# Patient Record
Sex: Male | Born: 1950 | Race: White | Hispanic: No | Marital: Married | Smoking: Never smoker
Health system: Southern US, Community
[De-identification: ages and names within clinical notes are randomized; demographics above are authoritative.]

## PROBLEM LIST (undated history)

## (undated) DIAGNOSIS — R55 Syncope and collapse: Secondary | ICD-10-CM

## (undated) DIAGNOSIS — R400 Somnolence: Secondary | ICD-10-CM

## (undated) DIAGNOSIS — R7309 Other abnormal glucose: Secondary | ICD-10-CM

## (undated) DIAGNOSIS — E669 Obesity, unspecified: Secondary | ICD-10-CM

## (undated) DIAGNOSIS — R569 Unspecified convulsions: Secondary | ICD-10-CM

## (undated) HISTORY — PX: OTHER SURGICAL HISTORY: SHX169

## (undated) HISTORY — DX: Somnolence: R40.0

## (undated) HISTORY — DX: Unspecified convulsions: R56.9

## (undated) HISTORY — PX: TONSILLECTOMY: SUR1361

## (undated) HISTORY — DX: Other abnormal glucose: R73.09

## (undated) HISTORY — PX: KNEE SURGERY: SHX244

## (undated) HISTORY — DX: Syncope and collapse: R55

## (undated) HISTORY — DX: Obesity, unspecified: E66.9

## (undated) HISTORY — PX: CARDIAC DEFIBRILLATOR PLACEMENT: SHX171

---

## 2003-06-21 ENCOUNTER — Encounter: Payer: Self-pay | Admitting: Emergency Medicine

## 2003-06-21 ENCOUNTER — Observation Stay (HOSPITAL_COMMUNITY): Admission: AD | Admit: 2003-06-21 | Discharge: 2003-06-22 | Payer: Self-pay | Admitting: Emergency Medicine

## 2007-03-29 ENCOUNTER — Emergency Department: Payer: Self-pay | Admitting: Emergency Medicine

## 2007-03-29 ENCOUNTER — Inpatient Hospital Stay (HOSPITAL_COMMUNITY): Admission: EM | Admit: 2007-03-29 | Discharge: 2007-04-01 | Payer: Self-pay | Admitting: Emergency Medicine

## 2007-03-31 ENCOUNTER — Ambulatory Visit: Payer: Self-pay | Admitting: Internal Medicine

## 2007-03-31 ENCOUNTER — Encounter: Payer: Self-pay | Admitting: Internal Medicine

## 2007-04-25 ENCOUNTER — Ambulatory Visit: Payer: Self-pay | Admitting: Internal Medicine

## 2007-05-26 ENCOUNTER — Ambulatory Visit: Payer: Self-pay | Admitting: Internal Medicine

## 2007-07-28 ENCOUNTER — Ambulatory Visit: Payer: Self-pay | Admitting: Internal Medicine

## 2007-08-25 ENCOUNTER — Ambulatory Visit: Payer: Self-pay

## 2007-11-21 ENCOUNTER — Ambulatory Visit: Payer: Self-pay | Admitting: Internal Medicine

## 2008-03-06 ENCOUNTER — Ambulatory Visit: Payer: Self-pay | Admitting: Internal Medicine

## 2008-07-19 ENCOUNTER — Ambulatory Visit: Payer: Self-pay

## 2008-10-31 ENCOUNTER — Ambulatory Visit: Payer: Self-pay

## 2009-05-25 DIAGNOSIS — R7309 Other abnormal glucose: Secondary | ICD-10-CM | POA: Insufficient documentation

## 2009-05-25 DIAGNOSIS — R55 Syncope and collapse: Secondary | ICD-10-CM | POA: Insufficient documentation

## 2009-05-25 DIAGNOSIS — I951 Orthostatic hypotension: Secondary | ICD-10-CM | POA: Insufficient documentation

## 2009-06-04 ENCOUNTER — Ambulatory Visit: Payer: Self-pay | Admitting: Internal Medicine

## 2009-10-10 ENCOUNTER — Ambulatory Visit: Payer: Self-pay

## 2009-10-30 ENCOUNTER — Emergency Department (HOSPITAL_COMMUNITY): Admission: EM | Admit: 2009-10-30 | Discharge: 2009-10-30 | Payer: Self-pay | Admitting: Emergency Medicine

## 2009-11-05 ENCOUNTER — Encounter: Payer: Self-pay | Admitting: Internal Medicine

## 2009-11-27 ENCOUNTER — Encounter (INDEPENDENT_AMBULATORY_CARE_PROVIDER_SITE_OTHER): Payer: Self-pay | Admitting: *Deleted

## 2010-02-05 ENCOUNTER — Ambulatory Visit: Payer: Self-pay | Admitting: Internal Medicine

## 2010-08-19 ENCOUNTER — Ambulatory Visit: Payer: Self-pay | Admitting: Internal Medicine

## 2010-10-29 ENCOUNTER — Ambulatory Visit: Payer: Self-pay
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
## 2011-01-27 NOTE — Procedures (Signed)
Summary: device/saf   Current Medications (verified): 1)  Multivitamins  Tabs (Multiple Vitamin) .... Take One Tablet Once Daily 2)  Fish Oil  Oil (Fish Oil) .... Take Once Daily 3)  Dilantin 100 Mg Caps (Phenytoin Sodium Extended) .... 3 Caps At Bedtime 1 Capsule in The Morning  Allergies (verified): No Known Drug Allergies   ILR Following MD Sherryl Manges, MD DOI:  04/01/2007 Vendor:  Medtronic     Model Number:  4540     Serial Number [id number]       Tachy Episodes:  2      Tech Comments:  DEVICE REACHED RRT ON 09-09-10.  2 TACHY EPISODES RECORDED THAT WERE NOISE.  PLAN PER SK. Vella Kohler  October 29, 2010 4:39 PM

## 2011-01-27 NOTE — Procedures (Signed)
Summary: PC2   Current Medications (verified): 1)  Multivitamins  Tabs (Multiple Vitamin) .... Take One Tablet Once Daily 2)  Fish Oil  Oil (Fish Oil) .... Take Once Daily 3)  Dilantin 100 Mg Caps (Phenytoin Sodium Extended) .... 3 Caps At Bedtime  Allergies (verified): No Known Drug Allergies   ILR Following MD Sherryl Manges, MD DOI:  04/01/2007 Vendor:  Medtronic     Model Number:  9528     Serial Number [id number]       Tachy Episodes:  0     Brady Episodes:  0 ILR Next Due 07/28/2010  Tech Comments:  Normal device function. No episodes, no syncopal spells.  ROV 6 months SK.  Pt will call back with symptoms prior to appt. Gypsy Balsam RN BSN  February 06, 2010 10:32 AM

## 2011-01-27 NOTE — Cardiovascular Report (Signed)
Summary: Office Visit  Office Visit   Imported By: Marylou Mccoy 08/29/2010 16:00:53  _____________________________________________________________________  External Attachment:    Type:   Image     Comment:   External Document

## 2011-01-28 ENCOUNTER — Encounter (INDEPENDENT_AMBULATORY_CARE_PROVIDER_SITE_OTHER): Payer: BC Managed Care – PPO

## 2011-01-28 ENCOUNTER — Encounter: Payer: Self-pay | Admitting: Internal Medicine

## 2011-01-28 ENCOUNTER — Ambulatory Visit: Admit: 2011-01-28 | Payer: Self-pay | Admitting: Internal Medicine

## 2011-01-28 DIAGNOSIS — R55 Syncope and collapse: Secondary | ICD-10-CM

## 2011-02-12 NOTE — Procedures (Signed)
Summary: [facility] Device [facility]   Current Medications (verified): 1)  Multivitamins  Tabs (Multiple Vitamin) .... Take One Tablet Once Daily 2)  Fish Oil  Oil (Fish Oil) .... Take Once Daily 3)  Dilantin 100 Mg Caps (Phenytoin Sodium Extended) .... 3 Caps At Bedtime 1 Capsule in The Morning  Allergies (verified): No Known Drug Allergies   ILR Following MD Sherryl Manges, MD DOI:  04/01/2007 Vendor:  Medtronic     Model Number:  9528     Serial Number [id number]       Tachy Episodes:  0     Brady Episodes:  0  Tech Comments:  Battery is @ EOS.  Mark Salas will call and schedule an appt. with Dr. Graciela Husbands when he is ready to schedule an explant.   Mark Harm, LPN  January 28, 2011 4:36 PM

## 2011-04-01 LAB — BASIC METABOLIC PANEL
CO2: 23 mEq/L (ref 19–32)
Chloride: 103 mEq/L (ref 96–112)
Creatinine, Ser: 1.27 mg/dL (ref 0.4–1.5)
Sodium: 135 mEq/L (ref 135–145)

## 2011-04-01 LAB — RAPID URINE DRUG SCREEN, HOSP PERFORMED
Amphetamines: NOT DETECTED
Opiates: NOT DETECTED

## 2011-04-01 LAB — GLUCOSE, CAPILLARY: Glucose-Capillary: 113 mg/dL — ABNORMAL HIGH (ref 70–99)

## 2011-04-20 IMAGING — CT CT HEAD W/O CM
1 series · 16 of 30 positions shown, 20 images · non-contrast
Comparison: 03/29/07

CLINICAL DATA: Seizure

CT HEAD WITHOUT CONTRAST
TECHNIQUE: Contiguous axial images were obtained from the base of
the skull through the vertex without contrast.

[Series 2: trauma head · axial · 0.47mm/px · z∈[+159,+310]mm · 16 of 32 slices shown, 20 images]
[im 2/32  brain]
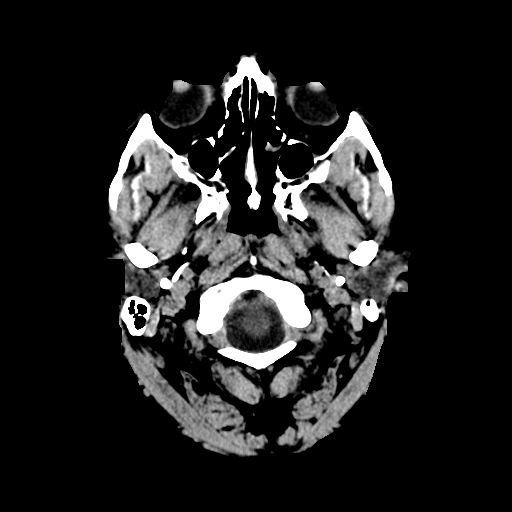
[im 2/32  bone]
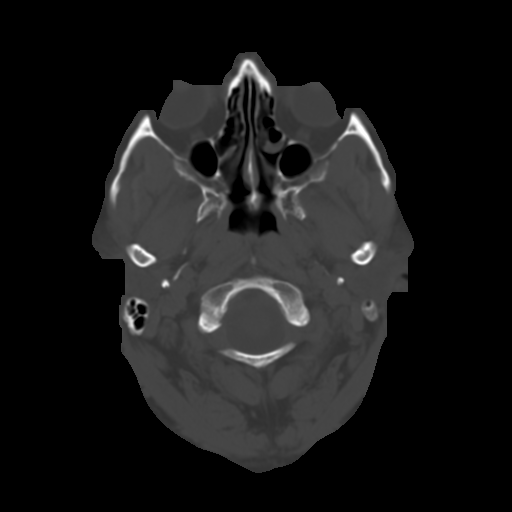
[im 4/32  brain]
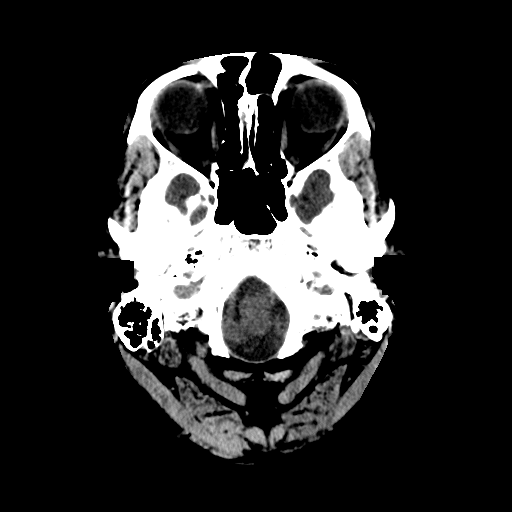
[im 6/32  brain]
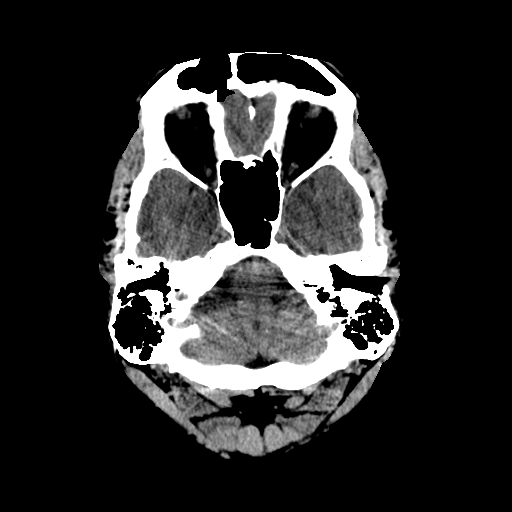
[im 8/32  brain]
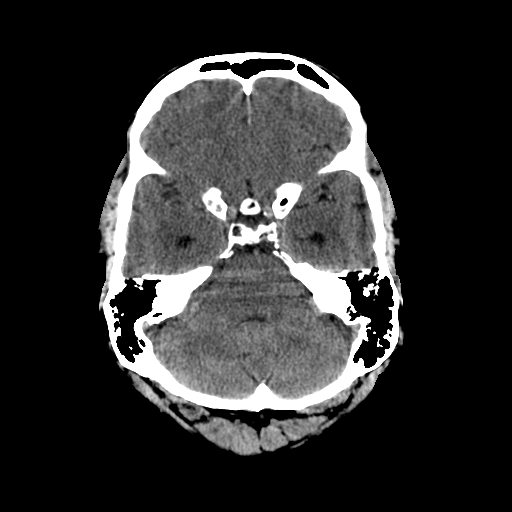
[im 9/32  brain]
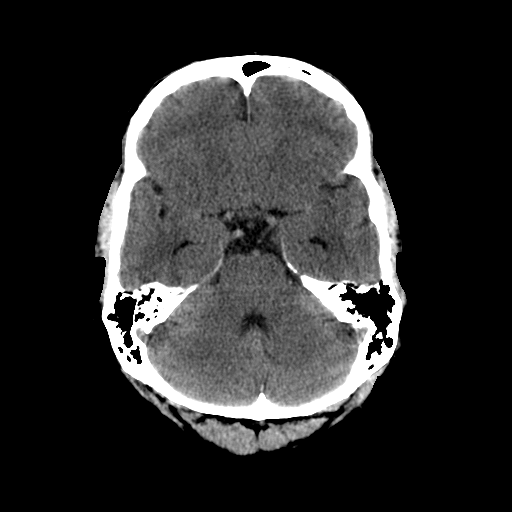
[im 9/32  bone]
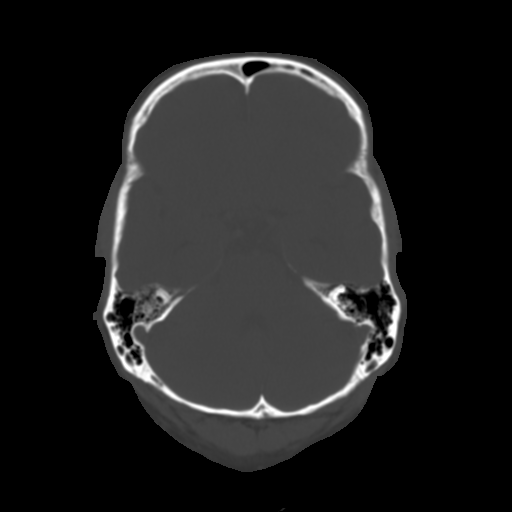
[im 11/32  brain]
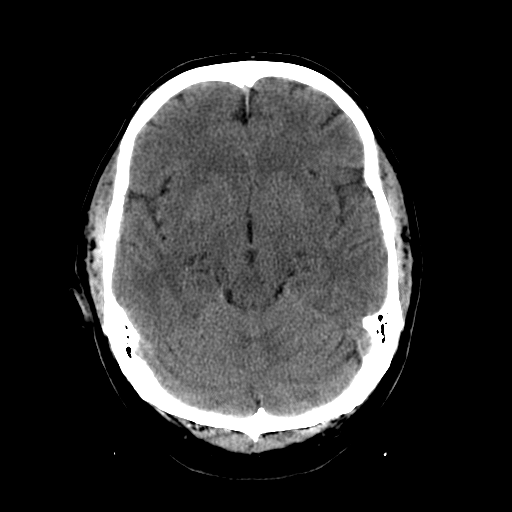
[im 13/32  brain]
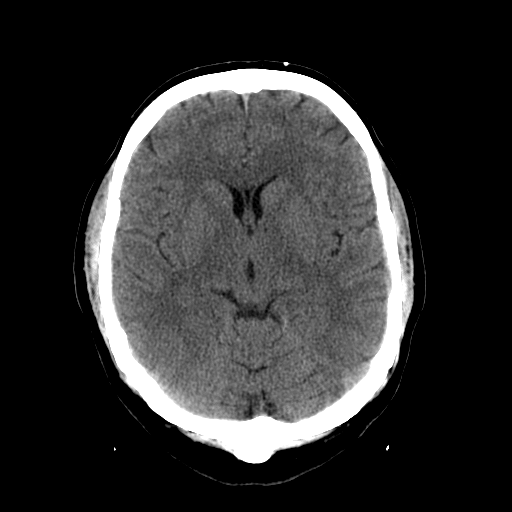
[im 15/32  brain]
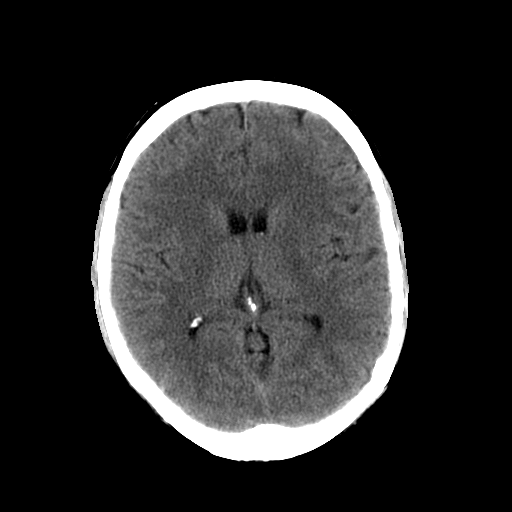
[im 17/32  brain]
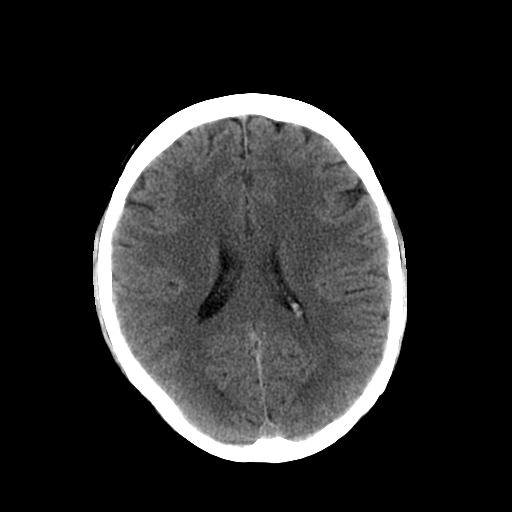
[im 17/32  bone]
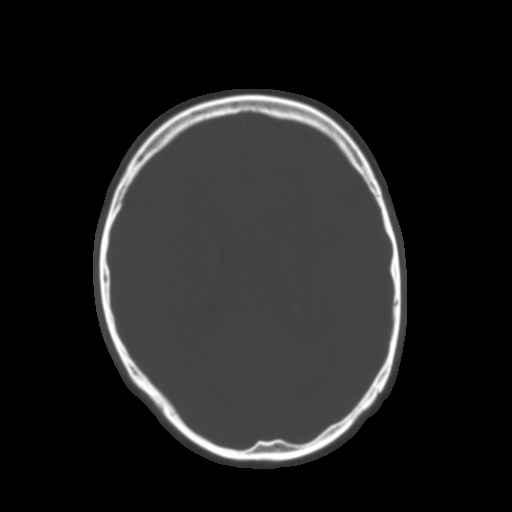
[im 19/32  brain]
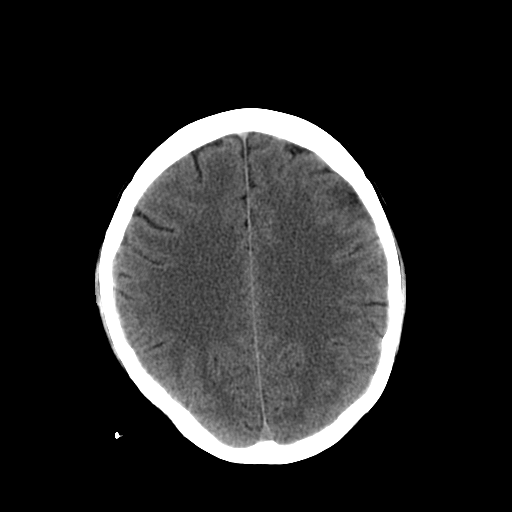
[im 21/32  brain]
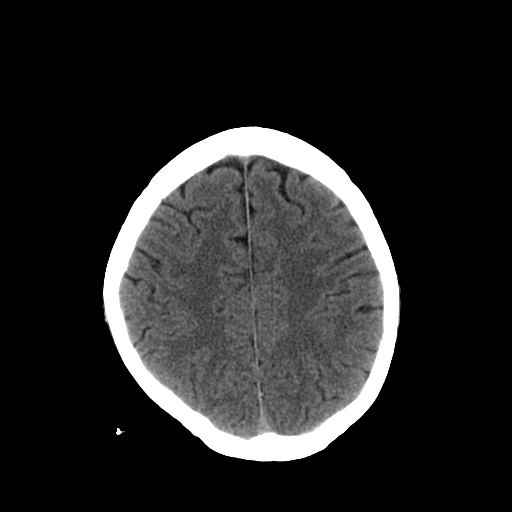
[im 23/32  brain]
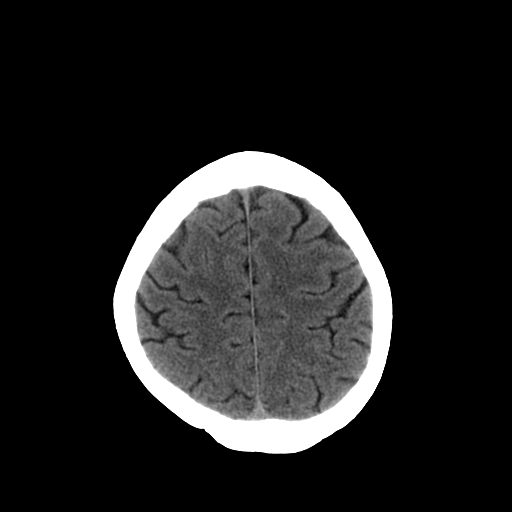
[im 24/32  brain]
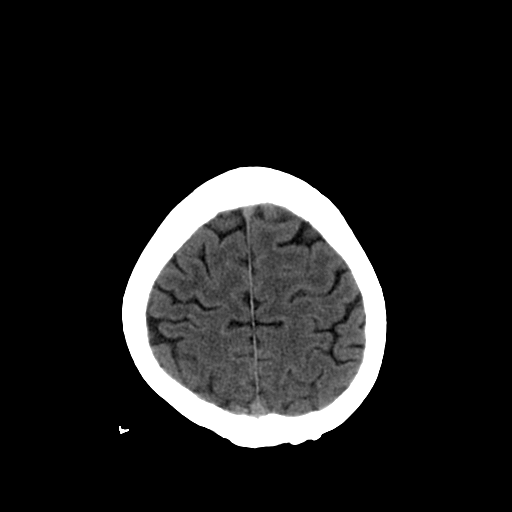
[im 24/32  bone]
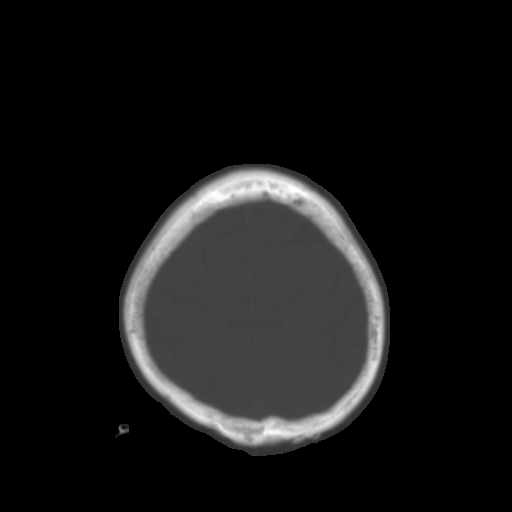
[im 26/32  brain]
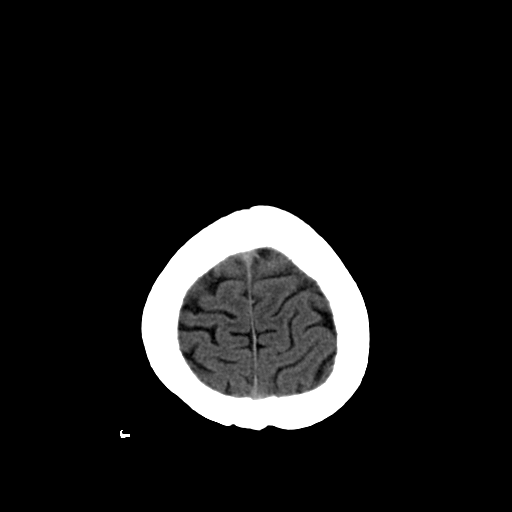
[im 28/32  brain]
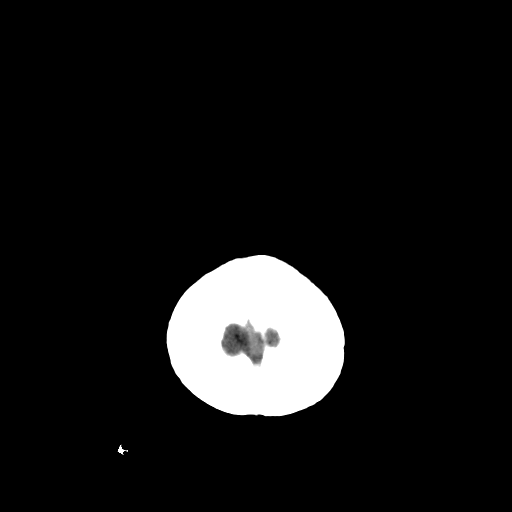
[im 30/32  brain]
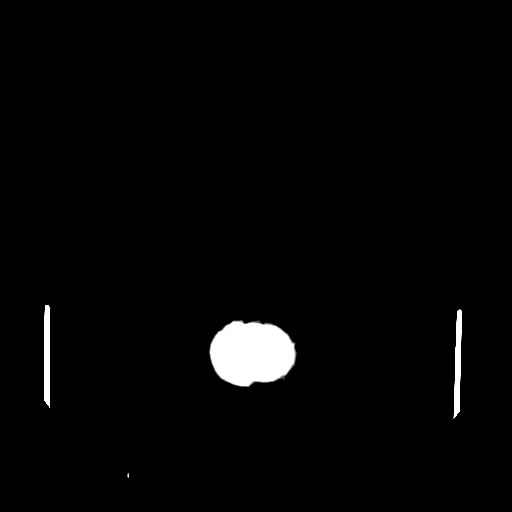

[16 of 30 positions shown; findings below may reference images not displayed]

FINDINGS: The brain has a normal appearance without evidence for hemorrhage,
infarction, hydrocephalus, or mass lesion.  There is no extra axial
fluid collection.  The skull and paranasal sinuses are normal.
IMPRESSION: Negative exam.

## 2011-04-21 ENCOUNTER — Ambulatory Visit (INDEPENDENT_AMBULATORY_CARE_PROVIDER_SITE_OTHER): Payer: BC Managed Care – PPO | Admitting: *Deleted

## 2011-04-21 ENCOUNTER — Encounter: Payer: Self-pay | Admitting: *Deleted

## 2011-04-21 VITALS — BP 144/98 | HR 90 | Ht 68.0 in | Wt 236.0 lb

## 2011-04-21 DIAGNOSIS — R55 Syncope and collapse: Secondary | ICD-10-CM

## 2011-04-23 NOTE — Patient Instructions (Signed)
Patient to follow up as scheduled. He came today for [facility] visit to have form filled out.  The patient was given completed form by Dr. Berton Mount.

## 2011-05-12 NOTE — Assessment & Plan Note (Signed)
[facility]                         ELECTROPHYSIOLOGY OFFICE NOTE   NAME:Mark Salas, Mark Salas                        MRN:          [id number]  DATE:03/06/2008                            DOB:          [birth year]    Mark Salas is seen following a syncopal episode.  He is status post loop  recorder implantation.  He has had no recurrent syncope.  Interrogation  of his loop recorder demonstrates no intercurrent events.   On examination, his blood pressure was 126/76, a pulse of 83.  His lungs were clear.  His heart sounds were regular.  Extremities were without edema   IMPRESSION:  1. Syncope.  2. Status post loop recorder implantation.   Mark Salas is stable.  We will see him again in four months' time.     Duke Salvia, MD, [facility]  Electronically Signed    SCK/MedQ  DD: 03/06/2008  DT: 03/07/2008  Job #: [id number]

## 2011-05-12 NOTE — Assessment & Plan Note (Signed)
[facility]                         ELECTROPHYSIOLOGY OFFICE NOTE   NAME:[name], Mark Salas                        MRN:          [id number]  DATE:07/28/2007                            DOB:          [birth year]    Mark Salas is seen.  He ha a history of syncope.  His loop recorder was  interrogated.  There are no intercurrent events.   He will be seen again in 2 months' time.     Duke Salvia, MD, [facility]  Electronically Signed    SCK/MedQ  DD: 07/28/2007  DT: 07/29/2007  Job #: [id number]

## 2011-05-12 NOTE — Assessment & Plan Note (Signed)
[facility]                         ELECTROPHYSIOLOGY OFFICE NOTE   NAME:Brash, CONSUELO THAYNE                        MRN:          [id number]  DATE:11/21/2007                            DOB:          1951/01/05    Mr. Interrante has had no further syncope.  He has an implantable loop  recorder which was interrogated nothing.   He has had no complaints of chest pain or shortness of breath.   PHYSICAL EXAMINATION:  VITAL SIGNS:  On examination today, his blood  pressure was mildly elevated at 148/85, pulse was 78.  LUNGS:  Clear.  HEART:  Heart sounds were regular.  EXTREMITIES:  Without edema.   Interrogation of his loop recorder demonstrated no intercurrent  episodes.   Will plan to see him again in 4 months time.  He also brings to me a  letter from the [facility], asking for a definition of syncope.  I will try to  contact them about this.     Duke Salvia, MD, [facility]  Electronically Signed    SCK/MedQ  DD: 11/21/2007  DT: 11/22/2007  Job #: [id number]

## 2011-05-12 NOTE — Assessment & Plan Note (Signed)
[facility]                         ELECTROPHYSIOLOGY OFFICE NOTE   NAME:Say, YONI LOBOS                        MRN:          [id number]  DATE:08/25/2007                            DOB:          23-Sep-1951    Mr. Hardenbrook is seen in the clinic on August 25, 2007 for followup of his  internal loop recorder DX, model No. 7528.  Date of implant was April 01, 2007, for syncope.   On interrogation of his device today there no events stored and he will  be seen again in 3 months' time.      Altha Harm, LPN  Electronically Signed      Duke Salvia, MD, [facility]  Electronically Signed   PO/MedQ  DD: 08/25/2007  DT: 08/26/2007  Job #: [id number]

## 2011-05-15 NOTE — H&P (Signed)
NAME:  Mark Salas, Mark Salas                           ACCOUNT NO.:  [id number]   MEDICAL RECORD NO.:  [id number]                   PATIENT TYPE:  EMS   LOCATION:  MINO                                 FACILITY:  [facility]   PHYSICIAN:  Gustavus Messing. Orlin Hilding, M.D.          DATE OF BIRTH:  06-23-1951   DATE OF ADMISSION:  06/21/2003  DATE OF DISCHARGE:                                HISTORY & PHYSICAL   CHIEF COMPLAINT:  Seizures.   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  He was at work at [employer] this morning when he had a seizure; his  coworkers described that he was making strange noises, fell down, lost  consciousness, bit his tongue, had convulsions in all four extremities, and  lost bladder control.  He subsequently has been postictal with  combativeness, which is gradually improving.   REVIEW OF SYSTEMS:  Unobtainable.  According to the wife, no complaints of  headache, fever, or any indisposition.  No history of tick or mosquito  exposure.   PAST MEDICAL HISTORY:  Negative for any surgery or medical problems.  Remote  history of polio affecting the left leg.   MEDICATIONS:  Multivitamin; no prescription drugs.   ALLERGIES:  No known drug allergies.   SOCIAL HISTORY:  No cigarette use.  Occasional beer.  Denies cocaine or  other recreational drugs.  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   FAMILY HISTORY:  Negative for seizures.   PHYSICAL EXAMINATION:  VITAL SIGNS:  Temperature is 97.7, pulse 112, BP  115/59.  HEENT:  Head is normocephalic, atraumatic.  NECK:  In a brace secondary to the fall with the seizure; it has not been  cleared yet.  HEART:  Regular rate and rhythm.  Slightly tachycardic.  LUNGS:  Clear to auscultation.  ABDOMEN:  Positive bowel sounds.  Soft, nontender.  EXTREMITIES:  Without edema.  NEUROLOGIC EXAM:   Mental status, he is somnolent, will arouse briefly to  stimulation, oriented to [facility] but not that he is in the ER.  He answers yes  or no questions but is not reliable.  Starts to snore again pretty quickly.  Cranial nerves II, pupils are equal and reactive.  Cannot see disc margins  well; I cannot really tell if he has a visual field cut or not.  II, IV, and  VI, extraocular movements are intact without nystagmus, ophthalmoparesis or  ptosis.  V, appear to be intact.  VII, no obvious facial asymmetry.  VIII,  hearing is intact.  IX intact, cannot really see his palate well because of  the neck brace.  He does have a tongue laceration that is not acutely  bleeding.  Motor exam, he moves all four extremities to command and  spontaneously,  required restraints.  There is no evidence of any weakness.  Reflexes are 1+ with downgoing toes.  Coordination, finger-to-nose is intact  bilaterally.   LABORATORY DATA:  CT scan of the brain is normal.  CBC, white blood cell  count is 6.4, hemoglobin 14.2, hematocrit 40.7, platelets 260.  Sodium 139,  potassium 3.8, chloride 105, CO2 of 24, BUN 19, creatinine 1.2, glucose 128,  calcium 9.5.  SGOT 28, SGPT 29, alk phos 71, total bili is 1.6, which is  slightly elevated, protein 7.0, albumin 4.1.  CK was 211, troponin was 0.01.   IMPRESSION:  Seizure, etiology is yet unclear.   PLAN:  Admit for 24-hour observation, check EEG, MRI of the brain with and  without contrast, Ativan p.r.n.  We will decide regarding the need for  anticonvulsants at a later date.                                               Catherine A. Orlin Hilding, M.D.    CAW/MEDQ  D:  06/21/2003  T:  06/22/2003  Job:  [id number]
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
## 2011-05-15 NOTE — Op Note (Signed)
NAME:  Mark Salas, FURIA NO.:  [id number]   MEDICAL RECORD NO.:  [id number]          PATIENT TYPE:  INP   LOCATION:  3743                         FACILITY:  [facility]   PHYSICIAN:  Duke Salvia, MD, FACCDATE OF BIRTH:  11-Nov-1951   DATE OF PROCEDURE:  03/29/2007  DATE OF DISCHARGE:                               OPERATIVE REPORT   PREOPERATIVE DIAGNOSIS:  Syncope.   POSTOPERATIVE DIAGNOSIS:  Syncope.   PROCEDURE:  Loop recorder insertion.   Following obtaining informed consent, the patient was then prepared for  loop recorder insertion.  The patient was transcutaneously mapped and  then prepped routinely.  Lidocaine was infiltrated at about 2 cm caudal  to the clavicle and 2 cm lateral to the sternum.  An incision was made  and carried down to the layer of the prepectoral fascia.  Using  electrocautery and sharp dissection, a pocket was formed similarly.  Hemostasis was obtained.   Two 2-0 silk sutures were used to secure a Medtronic [id number] H Reveal  [id number] loop recorder.  Hemostasis having been attained and the pocket  having been copiously irrigated with antibiotic containing saline  solution, the wound was closed in three layers in a normal fashion.  The  wound was washed and dried and a Benzoin and Steri-Strip dressing was  applied.  Needle counts, sponge counts, and instrument counts were  correct at the end of the procedure according to the staff.  The patient  tolerated the procedure without apparent complication.      Duke Salvia, MD, [facility]  Electronically Signed     SCK/MEDQ  D:  03/31/2007  T:  03/31/2007  Job:  [id number]
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
## 2011-09-25 NOTE — Progress Notes (Signed)
  Visit for [facility] form

## 2012-09-15 DIAGNOSIS — G479 Sleep disorder, unspecified: Secondary | ICD-10-CM | POA: Insufficient documentation

## 2012-09-15 DIAGNOSIS — Z5181 Encounter for therapeutic drug level monitoring: Secondary | ICD-10-CM | POA: Insufficient documentation

## 2012-09-15 DIAGNOSIS — G40309 Generalized idiopathic epilepsy and epileptic syndromes, not intractable, without status epilepticus: Secondary | ICD-10-CM | POA: Insufficient documentation

## 2012-09-15 DIAGNOSIS — G478 Other sleep disorders: Secondary | ICD-10-CM | POA: Insufficient documentation

## 2013-05-04 ENCOUNTER — Encounter: Payer: Self-pay | Admitting: Neurology

## 2013-05-04 DIAGNOSIS — G478 Other sleep disorders: Secondary | ICD-10-CM

## 2013-05-04 DIAGNOSIS — Z5181 Encounter for therapeutic drug level monitoring: Secondary | ICD-10-CM

## 2013-05-04 DIAGNOSIS — G40309 Generalized idiopathic epilepsy and epileptic syndromes, not intractable, without status epilepticus: Secondary | ICD-10-CM

## 2013-05-04 DIAGNOSIS — G479 Sleep disorder, unspecified: Secondary | ICD-10-CM

## 2013-05-05 ENCOUNTER — Encounter: Payer: Self-pay | Admitting: Neurology

## 2013-05-05 ENCOUNTER — Ambulatory Visit (INDEPENDENT_AMBULATORY_CARE_PROVIDER_SITE_OTHER): Payer: Commercial Managed Care - PPO | Admitting: Neurology

## 2013-05-05 VITALS — BP 138/79 | HR 66 | Ht 70.5 in | Wt 237.0 lb

## 2013-05-05 DIAGNOSIS — Z5181 Encounter for therapeutic drug level monitoring: Secondary | ICD-10-CM

## 2013-05-05 DIAGNOSIS — G40309 Generalized idiopathic epilepsy and epileptic syndromes, not intractable, without status epilepticus: Secondary | ICD-10-CM

## 2013-05-05 MED ORDER — PHENYTOIN SODIUM EXTENDED 100 MG PO CAPS: ORAL_CAPSULE | ORAL | Status: DC
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
## 2013-05-06 ENCOUNTER — Telehealth: Payer: Self-pay | Admitting: Neurology

## 2013-05-06 LAB — COMPREHENSIVE METABOLIC PANEL
ALT: 30 IU/L (ref 0–44)
Albumin/Globulin Ratio: 2.3 (ref 1.1–2.5)
Albumin: 4.5 g/dL (ref 3.6–4.8)
BUN: 21 mg/dL (ref 8–27)
Calcium: 9.9 mg/dL (ref 8.6–10.2)
Chloride: 107 mmol/L (ref 96–108)
GFR calc Af Amer: 103 mL/min/{1.73_m2} (ref >59)
GFR calc non Af Amer: 89 mL/min/{1.73_m2} (ref >59)
Glucose: 99 mg/dL (ref 65–99)
Potassium: 4.3 mmol/L (ref 3.5–5.2)
Total Protein: 6.5 g/dL (ref 6.0–8.5)

## 2013-05-06 LAB — PHENYTOIN LEVEL, TOTAL: Phenytoin Lvl: 2.4 ug/mL — ABNORMAL LOW (ref 10.0–20.0)

## 2013-05-06 LAB — CBC WITH DIFFERENTIAL
Basos: 0 % (ref 0–3)
Eos: 1 % (ref 0–5)
HCT: 41 % (ref 37.5–51.0)
Hemoglobin: 14.3 g/dL (ref 12.6–17.7)
Lymphs: 22 % (ref 14–46)
Neutrophils Absolute: 3.7 10*3/uL (ref 1.4–7.0)
Neutrophils Relative %: 71 % (ref 40–74)
RBC: 4.62 x10E6/uL (ref 4.14–5.80)

## 2013-05-06 NOTE — Telephone Encounter (Signed)
I called the patient. The blood work shows a low dilantin level (as usual), and the alk phos is slightly high due to the dilantin. No change in therapy.

## 2013-09-08 ENCOUNTER — Ambulatory Visit (INDEPENDENT_AMBULATORY_CARE_PROVIDER_SITE_OTHER): Payer: Commercial Managed Care - PPO | Admitting: Cardiovascular Disease

## 2013-09-08 ENCOUNTER — Encounter: Payer: Self-pay | Admitting: Cardiovascular Disease

## 2013-09-08 VITALS — BP 140/82 | HR 83 | Ht 71.0 in | Wt 237.0 lb

## 2013-09-08 DIAGNOSIS — R55 Syncope and collapse: Secondary | ICD-10-CM

## 2013-09-08 DIAGNOSIS — R9431 Abnormal electrocardiogram [ECG] [EKG]: Secondary | ICD-10-CM | POA: Insufficient documentation

## 2013-09-08 DIAGNOSIS — I951 Orthostatic hypotension: Secondary | ICD-10-CM

## 2013-09-08 NOTE — Assessment & Plan Note (Signed)
No recurrent syncopal episodes since 2008. Thus, I feel that it is safe for the patient to continue driving. Paperwork was filled today.

## 2013-09-08 NOTE — Patient Instructions (Addendum)
Your physician has requested that you have an echocardiogram. Echocardiography is a painless test that uses sound waves to create images of your heart. It provides your doctor with information about the size and shape of your heart and how well your heart's chambers and valves are working. This procedure takes approximately one hour. There are no restrictions for this procedure.  [facility] MYOVIEW  Your caregiver has ordered a Stress Test with nuclear imaging. The purpose of this test is to evaluate the blood supply to your heart muscle. This procedure is referred to as a "Non-Invasive Stress Test." This is because other than having an IV started in your vein, nothing is inserted or "invades" your body. Cardiac stress tests are done to find areas of poor blood flow to the heart by determining the extent of coronary artery disease (CAD). Some patients exercise on a treadmill, which naturally increases the blood flow to your heart, while others who are  unable to walk on a treadmill due to physical limitations have a pharmacologic/chemical stress agent called Lexiscan . This medicine will mimic walking on a treadmill by temporarily increasing your coronary blood flow.   Please note: these test may take anywhere between 2-4 hours to complete  PLEASE REPORT TO [facility]  THE VOLUNTEERS AT THE FIRST DESK WILL DIRECT YOU WHERE TO GO  Date of Procedure:________Friday, Sept 19________________  Arrival Time for Procedure:_______10:00__________________   PLEASE NOTIFY THE OFFICE AT LEAST 24 HOURS IN ADVANCE IF YOU ARE UNABLE TO KEEP YOUR APPOINTMENT.  [phone] AND  PLEASE NOTIFY NUCLEAR MEDICINE AT [facility] AT LEAST 24 HOURS IN ADVANCE IF YOU ARE UNABLE TO KEEP YOUR APPOINTMENT. [phone]  How to prepare for your Myoview test:  1. Do not eat or drink after midnight 2. No caffeine for 24 hours prior to test 3. No smoking 24 hours prior to test. 4. Your medication may be taken with water.   If your doctor stopped a medication because of this test, do not take that medication. 5. Ladies, please do not wear dresses.  Skirts or pants are appropriate. Please wear a short sleeve shirt. 6. No perfume, cologne or lotion. 7. Wear comfortable walking shoes. No heels!

## 2013-09-08 NOTE — Assessment & Plan Note (Signed)
His previous EKG was completely normal. EKG shows prominent Q waves in leads 2, 3 and aVF and showed Q waves in V5 and V6. The PR interval is short. He also has significant repolarization abnormalities that were not evident on prior EKG. These could represent atypical delta waves and WPW pattern. The EKG was reviewed with Dr. Ladona Ridgel.  The other possibility is previous inferoposterior infarct. However, the patient has no convincing evidence of angina. Given the significant change in his EKG, I recommend evaluation with an echocardiogram as well as a treadmill nuclear stress test. A treadmill stress test is on would not be diagnostic due to significant ST abnormalities at baseline EKG.

## 2013-09-08 NOTE — Progress Notes (Signed)
HPI  This is a pleasant 62 year old man who is here today for followup visit. Purpose of this visit is to fill out DMV paperwork. He had one single episode of syncope in 2008 with extensive work up. He was seen by Dr. Graciela Husbands and had an echocardiogram as well as a loop recorder. There was no evidence of documented arrhythmia and no evidence of structural heart abnormalities.  He is listed as having a seizure disorder. However, it appears from his description that he was placed on Dilantin given the unexplained syncope. He denies any chest pain, palpitations, dizziness, syncope or presyncope. He has mild exertional dyspnea. 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     No Known Allergies   Current Outpatient Prescriptions on File Prior to Visit  Medication Sig Dispense Refill  . cholecalciferol (VITAMIN D) 1000 UNITS tablet Take 1,000 Units by mouth daily.      . fish oil-omega-3 fatty acids 1000 MG capsule 1 g daily.        Marland Kitchen glucosamine-chondroitin 500-400 MG tablet Take 1 tablet by mouth 2 (two) times daily.      . Multiple Vitamin (MULTIVITAMIN) tablet Take 1 tablet by mouth daily.        . phenytoin (DILANTIN) 100 MG ER capsule Take three tablets in the pm and one tablet in the am.  120 capsule  11   No current facility-administered medications on file prior to visit.     Past Medical History  Diagnosis Date  . Syncope and collapse   . Hypotension   . Other abnormal glucose   . Seizures   . Obesity   . Drowsiness     Excessive daytime drowsiness     Past Surgical History  Procedure Laterality Date  . Tonsillectomy    . Knee surgery      right  . Loop recorder      Medtronic Reveal     Family History  Problem Relation Age of Onset  . Stroke Mother   . Heart attack Father   . Stroke Father      History   Social History  . Marital Status: Married    Spouse Name: N/A    Number of Children: 2  . Years of  Education: Masters   Occupational History  . 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 Smoking status: Never Smoker   . Smokeless tobacco: Not on file  . Alcohol Use: .5 - 1 oz/week    1-2 drink(s) per week     Comment: Consumes 2-3 drinks per day  . Drug Use: No  . Sexual Activity: Not on file   Other Topics Concern  . Not on file   Social History Narrative  . No narrative on file     ROS  a 10 point review of system was performed. It's negative other than what is mentioned in the history of present illness.   PHYSICAL EXAM   BP 140/82  Pulse 83  Ht 5\' 11"  (1.803 m)  Wt 237 lb (107.502 kg)  BMI 33.07 kg/m2 Constitutional: He is oriented to person, place, and time. He appears well-developed and well-nourished. No distress.  HENT: No nasal discharge.  Head: Normocephalic and atraumatic.  Eyes: Pupils are equal and round. Right eye exhibits no discharge. Left eye exhibits no discharge.  Neck: Normal range of motion. Neck supple. No JVD present. No thyromegaly present.  Cardiovascular:  Normal rate, regular rhythm, normal heart sounds and. Exam reveals no gallop and no friction rub. No murmur heard.  Pulmonary/Chest: Effort normal and breath sounds normal. No stridor. No respiratory distress. He has no wheezes. He has no rales. He exhibits no tenderness.  Abdominal: Soft. Bowel sounds are normal. He exhibits no distension. There is no tenderness. There is no rebound and no guarding.  Musculoskeletal: Normal range of motion. He exhibits no edema and no tenderness.  Neurological: He is alert and oriented to person, place, and time. Coordination normal.  Skin: Skin is warm and dry. No rash noted. He is not diaphoretic. No erythema. No pallor.  Psychiatric: He has a normal mood and affect. His behavior is normal. Judgment and thought content normal.       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 T wave inversion in the inferior leads as well as V2 to V4. These changes are new. There is short PR with possible delta wave.    ASSESSMENT AND PLAN

## 2013-09-15 ENCOUNTER — Other Ambulatory Visit: Payer: Self-pay

## 2013-09-15 ENCOUNTER — Ambulatory Visit: Payer: Self-pay | Admitting: Cardiovascular Disease

## 2013-09-15 DIAGNOSIS — R9431 Abnormal electrocardiogram [ECG] [EKG]: Secondary | ICD-10-CM

## 2013-09-15 DIAGNOSIS — R55 Syncope and collapse: Secondary | ICD-10-CM

## 2013-09-18 ENCOUNTER — Telehealth: Payer: Self-pay

## 2013-09-18 NOTE — Telephone Encounter (Signed)
Message copied by Marilynne Halsted on Mon Sep 18, 2013  8:55 AM ------      Message from: Lorine Bears A      Created: Mon Sep 18, 2013  8:23 AM       Inform patient that  stress test was normal. ------

## 2013-09-18 NOTE — Telephone Encounter (Signed)
Left detailed message on machine & asked pt to call with any questions or concerns.

## 2013-09-29 ENCOUNTER — Other Ambulatory Visit: Payer: Self-pay

## 2013-09-29 ENCOUNTER — Other Ambulatory Visit (INDEPENDENT_AMBULATORY_CARE_PROVIDER_SITE_OTHER): Payer: Commercial Managed Care - PPO

## 2013-09-29 DIAGNOSIS — R9431 Abnormal electrocardiogram [ECG] [EKG]: Secondary | ICD-10-CM

## 2013-09-29 DIAGNOSIS — R0602 Shortness of breath: Secondary | ICD-10-CM

## 2013-09-29 DIAGNOSIS — R55 Syncope and collapse: Secondary | ICD-10-CM

## 2013-10-04 ENCOUNTER — Telehealth: Payer: Self-pay

## 2013-10-04 NOTE — Telephone Encounter (Signed)
Message copied by Marilynne Halsted on Wed Oct 04, 2013  9:22 AM ------      Message from: Mark Salas      Created: Tue Oct 03, 2013  5:45 PM       Inform patient that echo was normal. ------

## 2013-10-04 NOTE — Telephone Encounter (Signed)
Left detailed message of results.  Asked pt to call with any questions or concerns.

## 2014-04-15 ENCOUNTER — Other Ambulatory Visit: Payer: Self-pay | Admitting: Neurology

## 2014-05-04 ENCOUNTER — Ambulatory Visit (INDEPENDENT_AMBULATORY_CARE_PROVIDER_SITE_OTHER): Payer: Commercial Managed Care - PPO | Admitting: Neurology

## 2014-05-04 ENCOUNTER — Encounter: Payer: Self-pay | Admitting: Neurology

## 2014-05-04 VITALS — BP 137/79 | HR 67 | Ht 70.0 in | Wt 236.0 lb

## 2014-05-04 DIAGNOSIS — G40309 Generalized idiopathic epilepsy and epileptic syndromes, not intractable, without status epilepticus: Secondary | ICD-10-CM

## 2014-05-04 MED ORDER — PHENYTOIN SODIUM EXTENDED 100 MG PO CAPS: ORAL_CAPSULE | ORAL | Status: DC

## 2014-05-04 NOTE — Patient Instructions (Signed)
Seizure, Adult A seizure means there is unusual activity in the brain. A seizure can cause changes in attention or behavior. Seizures often cause shaking (convulsions). Seizures often last from 30 seconds to 2 minutes. HOME CARE   If you are given medicines, take them exactly as told by your doctor.  Keep all doctor visits as told.  Do not swim or drive until your doctor says it is okay.  Teach others what to do if you have a seizure. They should:  Lay you on the ground.  Put a cushion under your head.  Loosen any tight clothing around your neck.  Turn you on your side.  Stay with you until you get better. GET HELP RIGHT AWAY IF:   The seizure lasts longer than 2 to 5 minutes.  The seizure is very bad.  The person does not wake up after the seizure.  The person's attention or behavior changes. Drive the person to the emergency room or call your local emergency services (911 in [place] MAKE SURE YOU:   Understand these instructions.  Will watch your condition.  Will get help right away if you are not doing well or get worse. Document Released: 06/01/2008 Document Revised: 03/07/2012 Document Reviewed: 12/02/2011 Exit[employer] Patient Information 2014 [employer].  

## 2014-05-04 NOTE — Progress Notes (Signed)
PATIENT: Mark Salas DOB: 03/24/1951  REASON FOR VISIT: follow up HISTORY FROM: patient  HISTORY OF PRESENT ILLNESS: Mark Salas is a 63 year old right-handed white male with a history of seizures. He returns today for follow-up. The patient has not had any seizures since last seen. Seizures have been well controlled on Dilantin. The patient continues tolerate Dilantin very well. The patient operates a motor vehicle. 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  No new medical issues since the last visit.   REVIEW OF SYSTEMS: Full 14 system review of systems performed and notable only for:  Constitutional: N/A  Eyes: N/A Ear/Nose/Throat: N/A  Skin: N/A  Cardiovascular: N/A  Respiratory: N/A  Gastrointestinal: N/A  Genitourinary: N/A Hematology/Lymphatic: N/A  Endocrine: N/A Musculoskeletal:N/A  Allergy/Immunology: N/A  Neurological: N/A Psychiatric: N/A Sleep: N/A   ALLERGIES: No Known Allergies  HOME MEDICATIONS: Outpatient Prescriptions Prior to Visit  Medication Sig Dispense Refill  . fish oil-omega-3 fatty acids 1000 MG capsule 1 g daily.        Marland Kitchen. glucosamine-chondroitin 500-400 MG tablet Take 1 tablet by mouth 2 (two) times daily.      . Multiple Vitamin (MULTIVITAMIN) tablet Take 1 tablet by mouth daily.        . phenytoin (DILANTIN) 100 MG ER capsule TAKE ONE CAPSULE BY MOUTH IN THE MORNING AND TAKE THREE CAPSULES BY MOUTH IN THE EVENING  120 capsule  0  . cholecalciferol (VITAMIN D) 1000 UNITS tablet Take 1,000 Units by mouth daily.       No facility-administered medications prior to visit.    PAST MEDICAL HISTORY: Past Medical History  Diagnosis Date  . Syncope and collapse   . Hypotension   . Other abnormal glucose   . Seizures   . Obesity   . Drowsiness     Excessive daytime drowsiness    PAST SURGICAL HISTORY: Past Surgical History  Procedure Laterality Date  . Tonsillectomy    . Knee surgery      right  . Loop recorder      Medtronic Reveal     FAMILY HISTORY: Family History  Problem Relation Age of Onset  . Stroke Mother   . Heart attack Father   . Stroke Father     SOCIAL HISTORY: History   Social History  . Marital Status: Married    Spouse Name: Rinaldo Cloudamela    Number of Children: 2  . Years of Education: Masters   Occupational History  . 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  [employer]   Social History Main Topics  . Smoking status: Never Smoker   . Smokeless tobacco: Never Used  . Alcohol Use: 0.5 - 1.0 oz/week    1-2 drink(s) per week     Comment: Consumes 2-3 drinks per day  . Drug Use: No  . Sexual Activity: Not on file   Other Topics Concern  . Not on file   Social History Narrative   Patient lives at home with his wife [name].   Patient works full time at [employer].   [employer].   Right handed.   Caffeine two daily soda.      PHYSICAL EXAM  Filed Vitals:   05/04/14 0749  BP: 137/79  Pulse: 67  Height: 5\' 10"  (1.778 m)  Weight: 236 lb (107.049 kg)   Body mass index is 33.86 kg/(m^2).  Generalized: Well developed, in no acute distress   Neurological examination  Mentation: Alert oriented  to time, place, history taking. Follows all commands speech and language fluent Cranial nerve II-XII:  Extraocular movements were full, visual field were full on confrontational test.  Motor: The motor testing reveals 5 over 5 strength of all 4 extremities. Good symmetric motor tone is noted throughout.  Coordination: Cerebellar testing reveals good finger-nose-finger and heel-to-shin bilaterally.  Gait and station: Gait is normal. Tandem gait is normal. Romberg is negative. No drift is seen.  Reflexes: Deep tendon reflexes are symmetric and normal bilaterally.    DIAGNOSTIC DATA (LABS, IMAGING, TESTING) - I reviewed patient records, labs, notes, testing and imaging myself where available.  Lab Results  Component Value Date   WBC 5.3 05/05/2013   HGB 14.3  05/05/2013   HCT 41.0 05/05/2013   MCV 89 05/05/2013   PLT 211 05/05/2013      Component Value Date/Time   NA 142 05/05/2013 0820   NA 135 10/30/2009 1425   K 4.3 05/05/2013 0820   CL 107 05/05/2013 0820   CO2 26 05/05/2013 0820   GLUCOSE 99 05/05/2013 0820   GLUCOSE 118* 10/30/2009 1425   BUN 21 05/05/2013 0820   BUN 15 10/30/2009 1425   CREATININE 0.93 05/05/2013 0820   CALCIUM 9.9 05/05/2013 0820   PROT 6.5 05/05/2013 0820   AST 20 05/05/2013 0820   ALT 30 05/05/2013 0820   ALKPHOS 130* 05/05/2013 0820   BILITOT 0.4 05/05/2013 0820   GFRNONAA 89 05/05/2013 0820   GFRAA 103 05/05/2013 0820     ASSESSMENT AND PLAN 63 y.o. year old male  has a past medical history of Syncope and collapse; Hypotension; Other abnormal glucose; Seizures; Obesity; and Drowsiness. here with   1. Generalized convulsive epilepsy without mention of intractable epilepsy  Seizures have been well controlled on dilantin. Continue Dilantin, will refill today Follow-up in 1 year or sooner if needed.     [name], MSN, NP-C 05/04/2014, 7:59 AM [facility] [address], [address] [place], [place] [address] [phone]  Note: This document was prepared with digital dictation and possible smart phrase technology. Any transcriptional errors that result from this process are unintentional. [name]

## 2014-05-15 ENCOUNTER — Telehealth: Payer: Self-pay | Admitting: Adult Health

## 2014-05-15 NOTE — Telephone Encounter (Signed)
Patient has lab work ordered from office visit on 05/04/14. I called the patient to remind him about the lab work and to let him know he can stop by the office anytime to get it drawn.

## 2014-05-18 ENCOUNTER — Other Ambulatory Visit (INDEPENDENT_AMBULATORY_CARE_PROVIDER_SITE_OTHER): Payer: Self-pay

## 2014-05-18 DIAGNOSIS — Z0289 Encounter for other administrative examinations: Secondary | ICD-10-CM

## 2014-05-18 LAB — COMPREHENSIVE METABOLIC PANEL
ALK PHOS: 120 IU/L — AB (ref 39–117)
ALT: 27 IU/L (ref 0–44)
AST: 19 IU/L (ref 0–40)
Albumin/Globulin Ratio: 2 (ref 1.1–2.5)
Albumin: 4.3 g/dL (ref 3.6–4.8)
BUN / CREAT RATIO: 19 (ref 10–22)
BUN: 18 mg/dL (ref 8–27)
CALCIUM: 9.4 mg/dL (ref 8.6–10.2)
CHLORIDE: 103 mmol/L (ref 96–108)
CO2: 21 mmol/L (ref 18–29)
Creatinine, Ser: 0.93 mg/dL (ref 0.76–1.27)
GFR calc Af Amer: 102 mL/min/{1.73_m2} (ref >59)
GFR calc non Af Amer: 88 mL/min/{1.73_m2} (ref >59)
Globulin, Total: 2.2 g/dL (ref 1.5–4.5)
Glucose: 123 mg/dL — ABNORMAL HIGH (ref 65–99)
POTASSIUM: 4 mmol/L (ref 3.5–5.2)
SODIUM: 136 mmol/L (ref 134–144)
Total Bilirubin: 0.3 mg/dL (ref 0.0–1.2)
Total Protein: 6.5 g/dL (ref 6.0–8.5)

## 2014-05-18 LAB — CBC WITH DIFFERENTIAL
BASOS: 0 %
Basophils Absolute: 0 10*3/uL (ref 0.0–0.2)
EOS ABS: 0.2 10*3/uL (ref 0.0–0.4)
EOS: 3 %
HCT: 38.7 % (ref 37.5–51.0)
HEMOGLOBIN: 14 g/dL (ref 12.6–17.7)
LYMPHS: 27 %
Lymphocytes Absolute: 1.3 10*3/uL (ref 0.7–3.1)
MCH: 31.5 pg (ref 26.6–33.0)
MCHC: 36.2 g/dL — AB (ref 31.5–35.7)
MCV: 87 fL (ref 79–97)
MONOS ABS: 0.3 10*3/uL (ref 0.1–0.9)
Monocytes: 6 %
NEUTROS PCT: 64 %
Neutrophils Absolute: 3.2 10*3/uL (ref 1.4–7.0)
PLATELETS: 207 10*3/uL (ref 150–379)
RBC: 4.45 x10E6/uL (ref 4.14–5.80)
RDW: 14.2 % (ref 12.3–15.4)
WBC: 5 10*3/uL (ref 3.4–10.8)

## 2014-05-18 LAB — PHENYTOIN LEVEL, TOTAL: PHENYTOIN LVL: 4.1 ug/mL — AB (ref 10.0–20.0)

## 2014-05-22 ENCOUNTER — Telehealth: Payer: Self-pay | Admitting: Neurology

## 2014-05-22 NOTE — Telephone Encounter (Signed)
Message copied by Stephanie Acre on Tue May 22, 2014  8:22 AM ------      Message from: Enedina Finner      Created: Tue May 22, 2014  8:17 AM                   ----- Message -----         From: [facility] Lab Results In Interface         Sent: 05/18/2014   4:42 PM           To: Butch Penny, NP             ------

## 2014-05-22 NOTE — Telephone Encounter (Signed)
I called patient. The blood work was unremarkable with exception that the alkaline phosphatase was minimally elevated, stable. The Dilantin level remains low, but the patient is well controlled on this dosing. No dosing adjustments.

## 2014-09-07 DIAGNOSIS — E785 Hyperlipidemia, unspecified: Secondary | ICD-10-CM | POA: Insufficient documentation

## 2014-11-14 ENCOUNTER — Encounter: Payer: Self-pay | Admitting: Neurology

## 2014-11-20 ENCOUNTER — Encounter: Payer: Self-pay | Admitting: Neurology

## 2015-05-10 ENCOUNTER — Ambulatory Visit (INDEPENDENT_AMBULATORY_CARE_PROVIDER_SITE_OTHER): Payer: Commercial Managed Care - PPO | Admitting: Neurology

## 2015-05-10 ENCOUNTER — Encounter: Payer: Self-pay | Admitting: Neurology

## 2015-05-10 VITALS — BP 128/72 | HR 67 | Ht 71.0 in | Wt 237.0 lb

## 2015-05-10 DIAGNOSIS — G40309 Generalized idiopathic epilepsy and epileptic syndromes, not intractable, without status epilepticus: Secondary | ICD-10-CM

## 2015-05-10 DIAGNOSIS — Z5181 Encounter for therapeutic drug level monitoring: Secondary | ICD-10-CM | POA: Diagnosis not present

## 2015-05-10 MED ORDER — PHENYTOIN SODIUM EXTENDED 100 MG PO CAPS: ORAL_CAPSULE | ORAL | Status: DC

## 2015-05-10 NOTE — Progress Notes (Signed)
Reason for visit: Seizures  Mark Salas is an 64 y.o. male  HiTia Alertstory of present illness:  Mark Salas is a 64 year old right-handed white male with a history of seizures, well controlled on Dilantin. The patient was last seen one year ago, he has not had any seizures since that time. The patient indicates that he is operating a motor vehicle, and he is working. He is not having any significant issues in this regard. The Dilantin is well tolerated, he denies any drowsiness. He denies any new medical issues that have come up since last seen. He returns for routine reevaluation. He had blood work done through his primary care physician within the last several weeks that included a CBC, comprehensive metabolic profile, cholesterol panel. He indicates that these were unremarkable. A Dilantin level was not checked.  Past Medical History  Diagnosis Date  . Syncope and collapse   . Hypotension   . Other abnormal glucose   . Seizures   . Obesity   . Drowsiness     Excessive daytime drowsiness    Past Surgical History  Procedure Laterality Date  . Tonsillectomy    . Knee surgery      right  . Loop recorder      Medtronic Reveal    Family History  Problem Relation Age of Onset  . Stroke Mother   . Heart attack Father   . Stroke Father     Social history:  reports that he has never smoked. He has never used smokeless tobacco. He reports that he drinks about 0.5 - 1.0 oz of alcohol per week. He reports that he does not use illicit drugs.   No Known Allergies  Medications:  Prior to Admission medications   Medication Sig Start Date End Date Taking? Authorizing Provider  fish oil-omega-3 fatty acids 1000 MG capsule 1 g daily.     Yes Historical Provider, MD  Multiple Vitamin (MULTIVITAMIN) tablet Take 1 tablet by mouth daily.     Yes Historical Provider, MD  phenytoin (DILANTIN) 100 MG ER capsule TAKE ONE CAPSULE BY MOUTH IN THE MORNING AND TAKE THREE CAPSULES BY MOUTH IN THE  EVENING 05/04/14  Yes Butch PennyMegan Millikan, NP    ROS:  Out of a complete 14 system review of symptoms, the patient complains only of the following symptoms, and all other reviewed systems are negative.  History of seizures  Blood pressure 128/72, pulse 67, height 5\' 11"  (1.803 m), weight 237 lb (107.502 kg).  Physical Exam  General: The patient is alert and cooperative at the time of the examination.  Neuromuscular: There is atrophy of the left lower leg as compared to the right.  Skin: No significant peripheral edema is noted.   Neurologic Exam  Mental status: The patient is alert and oriented x 3 at the time of the examination. The patient has apparent normal recent and remote memory, with an apparently normal attention span and concentration ability.   Cranial nerves: Facial symmetry is present. Speech is normal, no aphasia or dysarthria is noted. Extraocular movements are full. Visual fields are full.  Motor: The patient has good strength in all 4 extremities.  Sensory examination: Soft touch sensation is symmetric on the face, arms, and legs.  Coordination: The patient has good finger-nose-finger and heel-to-shin bilaterally.  Gait and station: The patient has a normal gait. Tandem gait is normal. Romberg is negative. No drift is seen.  Reflexes: Deep tendon reflexes are symmetric.   Assessment/Plan:  1.  History of seizures, well controlled  The patient is doing quite well at this time. We will continue the current dose of Dilantin. The Dilantin levels tend to run low, but the patient is well controlled on this dosage. He will otherwise follow-up in one year.  [name]. Keith Willis MD 05/11/2015 6:24 PM  [facility] [address] [place], [place] [address]  Phone [phone] Fax [phone]

## 2015-05-10 NOTE — Patient Instructions (Signed)

## 2015-05-11 LAB — PHENYTOIN LEVEL, TOTAL: Phenytoin Lvl: 6 ug/mL — ABNORMAL LOW (ref 10.0–20.0)

## 2015-05-13 ENCOUNTER — Telehealth: Payer: Self-pay

## 2015-05-13 NOTE — Telephone Encounter (Signed)
-----   Message from [name], MD sent at 05/11/2015  5:43 PM EDT ----- Dilantin level is low, patient has been well controlled on this dosing, no changes. Please call the patient. ----- Message -----    From: [facility] Lab Results In Interface    Sent: 05/11/2015   5:43 AM      To: [name], MD

## 2015-05-13 NOTE — Telephone Encounter (Signed)
Left voicemail relaying results on both home and cell numbers.

## 2015-08-29 DIAGNOSIS — E872 Acidosis: Secondary | ICD-10-CM | POA: Insufficient documentation

## 2015-08-29 DIAGNOSIS — E8729 Other acidosis: Secondary | ICD-10-CM | POA: Insufficient documentation

## 2015-08-29 DEATH — deceased

## 2015-09-13 DIAGNOSIS — I472 Ventricular tachycardia, unspecified: Secondary | ICD-10-CM | POA: Insufficient documentation

## 2015-09-18 ENCOUNTER — Telehealth: Payer: Self-pay | Admitting: Neurology

## 2015-09-18 NOTE — Telephone Encounter (Signed)
I called the patient, left a message. I will call back tomorrow.

## 2015-09-18 NOTE — Telephone Encounter (Signed)
I called patient at home and on the cell phone number, left messages. I will call back later.

## 2015-09-18 NOTE — Telephone Encounter (Addendum)
Pt was recently in the hospital at [facility]. Not a [place]. He was told there that they wanted to change some medications that was given to him by Dr. Anne Hahn. The pt does not want to change anything until he hears from Dr. Anne Hahn. Medication: phenytoin (DILANTIN) 100 MG ER capsule to change to keppra  twice a daily. Pt is going to continue to take Dilantin until he hears from Dr. Anne Hahn. Pt was coded while at work. Pt has an a-arythmia Pt would like to make appt to have a f/u as well. The next available appt is not until March. Pt expressed he was ok not making an appt right now that if Dr. Anne Hahn called and talked to him that would be fine . Pt just wanted to make Dr. Anne Hahn aware of what was going on with him.  Please call [phone]

## 2015-09-19 NOTE — Telephone Encounter (Signed)
I called the patient. The patient has had documented heart rhythm abnormalities , a  Defibrillator has been placed. The patient remains on Dilantin. In the past, MRI evaluation the brain and EEG study have been normal. The patient has had witnessed generalized seizure events with tongue biting. The question will be whether or not the heart rhythm abnormality is the etiology of the syncope and the seizure itself , or does he truly had epilepsy. The patient may decide to taper off of the Dilantin, if so, rechecking an EEG study should be done. The patient will follow-up for his regular revisit.

## 2015-10-11 DIAGNOSIS — Z95810 Presence of automatic (implantable) cardiac defibrillator: Secondary | ICD-10-CM | POA: Insufficient documentation

## 2015-10-25 DIAGNOSIS — R739 Hyperglycemia, unspecified: Secondary | ICD-10-CM | POA: Insufficient documentation

## 2016-04-16 DIAGNOSIS — G40209 Localization-related (focal) (partial) symptomatic epilepsy and epileptic syndromes with complex partial seizures, not intractable, without status epilepticus: Secondary | ICD-10-CM | POA: Insufficient documentation

## 2016-04-20 ENCOUNTER — Telehealth: Payer: Self-pay | Admitting: Neurology

## 2016-04-20 NOTE — Telephone Encounter (Signed)
Pt called in due having a seizure at work. Lasted only a few seconds, lost bladder control, passed out, admitted to [facility] for over night observation. He would like to know about coming in earlier than his May 19th appt. Please call [phone] home or [phone] cell. Thank you

## 2016-04-20 NOTE — Telephone Encounter (Signed)
Returned pt TC. Sooner appt scheduled 04/28/16. Pt will arrive 15 min prior to appt time.

## 2016-04-28 ENCOUNTER — Ambulatory Visit (INDEPENDENT_AMBULATORY_CARE_PROVIDER_SITE_OTHER): Payer: Commercial Managed Care - PPO | Admitting: Neurology

## 2016-04-28 ENCOUNTER — Encounter: Payer: Self-pay | Admitting: Neurology

## 2016-04-28 VITALS — BP 136/74 | HR 80 | Ht 71.0 in | Wt 242.5 lb

## 2016-04-28 DIAGNOSIS — G40309 Generalized idiopathic epilepsy and epileptic syndromes, not intractable, without status epilepticus: Secondary | ICD-10-CM

## 2016-04-28 MED ORDER — LEVETIRACETAM 500 MG PO TABS: 500.0000 mg | ORAL_TABLET | Freq: Two times a day (BID) | ORAL | Status: DC

## 2016-04-28 NOTE — Progress Notes (Signed)
Reason for visit: Seizures  Tia AlertDavid S Salas is an 65 y.o. male  History of present illness:  [name] is a 65 year old right-handed white male with a history of seizure events. In September 2016, he had a seizure-type episode, he was found to have ventricular tachycardia and torsades on an electrophysiologic evaluation, and he was felt that his seizure-type events could be related to this. A defibrillator/pacer was placed and the patient has done well, and he was continued on the Dilantin, but on 04/20/2016 he suffered another seizure event while at work. The patient was admitted overnight for observation, the pacemaker was interrogated and no evidence of cardiac rhythm and abnormalities were noted. The patient believes that he may have missed a dose or 2 of Dilantin prior to the onset of the seizure. He has never been able to tolerate Dilantin levels in the therapeutic range because of drowsiness. The patient did have urinary incontinence with the event, he denied any tongue biting. The episode occurred without any warning. He works as a Garment/textile technologistnurse anesthetist. The patient returns for an evaluation.  Past Medical History  Diagnosis Date  . Syncope and collapse   . Hypotension   . Other abnormal glucose   . Seizures (HCC)   . Obesity   . Drowsiness     Excessive daytime drowsiness    Past Surgical History  Procedure Laterality Date  . Tonsillectomy    . Knee surgery      right  . Loop recorder      Medtronic Reveal    Family History  Problem Relation Age of Onset  . Stroke Mother   . Heart attack Father   . Stroke Father     Social history:  reports that he has never smoked. He has never used smokeless tobacco. He reports that he drinks about 0.5 - 1.0 oz of alcohol per week. He reports that he does not use illicit drugs.   No Known Allergies  Medications:  Prior to Admission medications   Medication Sig Start Date End Date Taking? Authorizing Provider  aspirin EC 81 MG  tablet Take by mouth. 08/31/15 08/30/16 Yes Historical Provider, MD  levETIRAcetam (KEPPRA) 500 MG tablet Take 1 tablet (500 mg total) by mouth 2 (two) times daily. 04/28/16 05/28/16 Yes [name], MD  Calcium Citrate-Vitamin D (CALCIUM + D PO) Take by mouth.    Historical Provider, MD  fish oil-omega-3 fatty acids 1000 MG capsule 1 g daily.      Historical Provider, MD  glucosamine-chondroitin (GLUCOSAMINE-CHONDROITIN DS) 500-400 MG tablet Take by mouth.    Historical Provider, MD  Multiple Vitamin (MULTIVITAMIN) tablet Take 1 tablet by mouth daily.      Historical Provider, MD    ROS:  Out of a complete 14 system review of symptoms, the patient complains only of the following symptoms, and all other reviewed systems are negative.  Passing out, seizure  Blood pressure 136/74, pulse 80, height 5\' 11"  (1.803 m), weight 242 lb 8 oz (109.997 kg).  Physical Exam  General: The patient is alert and cooperative at the time of the examination. The patient is moderately obese.  Skin: No significant peripheral edema is noted.   Neurologic Exam  Mental status: The patient is alert and oriented x 3 at the time of the examination. The patient has apparent normal recent and remote memory, with an apparently normal attention span and concentration ability.   Cranial nerves: Facial symmetry is present. Speech is normal, no  aphasia or dysarthria is noted. Extraocular movements are full. Visual fields are full.  Motor: The patient has good strength in all 4 extremities.  Sensory examination: Soft touch sensation is symmetric on the face, arms, and legs.  Coordination: The patient has good finger-nose-finger and heel-to-shin bilaterally.  Gait and station: The patient has a normal gait. Tandem gait is normal. Romberg is negative. No drift is seen.  Reflexes: Deep tendon reflexes are symmetric.   Assessment/Plan:  1. Seizures with recent recurrence  The patient will be maintained on the  Keppra, a prescription was called in. The patient seems to be tolerating medication fairly well. He is currently off of Dilantin. We will follow-up in 6 months, he is not to operate a motor vehicle until he is seen next. He may return to work without restrictions at this time.  [name] MD 04/28/2016 8:32 PM  [facility] [address] [place], [place] [address]  Phone [phone] Fax [phone]

## 2016-04-29 ENCOUNTER — Telehealth: Payer: Self-pay | Admitting: *Deleted

## 2016-04-29 NOTE — Telephone Encounter (Signed)
Patient FMLA on [employer] desk.

## 2016-04-29 NOTE — Telephone Encounter (Signed)
Patient called to advise, Employer has forms for healthcare clearance to be filled out for patient. Patient advised we received FMLA forms today, patient states this is a different form to be filled out. Advised of $25 fee for each set of forms and 14 day turn around time. Patient will try to get form from employer and drop by our office tomorrow, will pay form fees at that time.

## 2016-05-01 DIAGNOSIS — Z0289 Encounter for other administrative examinations: Secondary | ICD-10-CM

## 2016-05-07 NOTE — Telephone Encounter (Signed)
Additional paperwork completed and faxed to Ezzard StandingPam Kemp, Occupation RN for [employer] [phone](F # [phone]) - permission to return to work w/o restrictions. Copy mailed to pt.

## 2016-05-13 ENCOUNTER — Telehealth: Payer: Self-pay | Admitting: *Deleted

## 2016-05-13 NOTE — Telephone Encounter (Signed)
Message [facility]: [facility]                  Taken 17-MAY-17 at  8:06AM by RMS ------------------------------------------------------------ Edgardo Roysaller DAVE Kaczynski                 CID [id number]  Patient SAME                 Pt's Dr Anne HahnWILLIS       Area Code 336 Phone# [phone] * DOB [phone]     RE CANCELLING 5/19 @9AM -WAS SEEN A FEW WEEKS AGO     AND IT ISNT NEEDED                                   Disp:Y/N N If Y = C/B If No Response In 20minutes ============================================================ Pt r/s for 11/17.

## 2016-05-15 ENCOUNTER — Ambulatory Visit: Payer: Commercial Managed Care - PPO | Admitting: Neurology

## 2016-10-30 ENCOUNTER — Ambulatory Visit (INDEPENDENT_AMBULATORY_CARE_PROVIDER_SITE_OTHER): Payer: Commercial Managed Care - PPO | Admitting: Neurology

## 2016-10-30 ENCOUNTER — Encounter: Payer: Self-pay | Admitting: Neurology

## 2016-10-30 VITALS — BP 143/83 | HR 76 | Ht 71.0 in | Wt 246.0 lb

## 2016-10-30 DIAGNOSIS — G40309 Generalized idiopathic epilepsy and epileptic syndromes, not intractable, without status epilepticus: Secondary | ICD-10-CM

## 2016-10-30 NOTE — Progress Notes (Signed)
Reason for visit: Seizures  Tia AlertDavid S Salas is an 65 y.o. male  History of present illness:  [name] is a 89 old right-handed white male with a history of seizures. The patient had not tolerated the Dilantin well, he was switched to Keppra, he has done well with this medication. He has gone 6 months without any seizures. He denies any drowsiness or irritability on the Keppra. He denies any other new medical issues that have come up since last seen. He returns for an evaluation.  Past Medical History:  Diagnosis Date  . Drowsiness    Excessive daytime drowsiness  . Hypotension   . Obesity   . Other abnormal glucose   . Seizures (HCC)   . Syncope and collapse     Past Surgical History:  Procedure Laterality Date  . KNEE SURGERY     right  . loop recorder     Medtronic Reveal  . TONSILLECTOMY      Family History  Problem Relation Age of Onset  . Stroke Mother   . Heart attack Father   . Stroke Father     Social history:  reports that he has never smoked. He has never used smokeless tobacco. He reports that he drinks about 0.5 - 1.0 oz of alcohol per week . He reports that he does not use drugs.   No Known Allergies  Medications:  Prior to Admission medications   Medication Sig Start Date End Date Taking? Authorizing Provider  Calcium Citrate-Vitamin D (CALCIUM + D PO) Take by mouth.    Historical Provider, MD  glucosamine-chondroitin (GLUCOSAMINE-CHONDROITIN DS) 500-400 MG tablet Take by mouth.    Historical Provider, MD  levETIRAcetam (KEPPRA) 500 MG tablet Take 1 tablet (500 mg total) by mouth 2 (two) times daily. 04/28/16 05/28/16  [name], MD  Multiple Vitamin (MULTIVITAMIN) tablet Take 1 tablet by mouth daily.      Historical Provider, MD    ROS:  Out of a complete 14 system review of symptoms, the patient complains only of the following symptoms, and all other reviewed systems are negative.  Seizures  Blood pressure (!) 143/83, pulse 76, height 5'  11" (1.803 m), weight 246 lb (111.6 kg).  Physical Exam  General: The patient is alert and cooperative at the time of the examination. The patient is moderately obese.  Skin: No significant peripheral edema is noted.   Neurologic Exam  Mental status: The patient is alert and oriented x 3 at the time of the examination. The patient has apparent normal recent and remote memory, with an apparently normal attention span and concentration ability.   Cranial nerves: Facial symmetry is present. Speech is normal, no aphasia or dysarthria is noted. Extraocular movements are full. Visual fields are full.  Motor: The patient has good strength in all 4 extremities.  Sensory examination: Soft touch sensation is symmetric on the face, arms, and legs.  Coordination: The patient has good finger-nose-finger and heel-to-shin bilaterally.  Gait and station: The patient has a normal gait. Tandem gait is normal. Romberg is negative. No drift is seen.  Reflexes: Deep tendon reflexes are symmetric.   Assessment/Plan:  1. History of seizures  The patient is doing well on Keppra, we will continue the medication at 1000 mg daily. The patient will follow-up in one year, sooner if needed.  [name]. Keith Willis MD 10/30/2016 9:17 AM  [facility] [address] [place], [place] [address]  Phone [phone] Fax [phone]

## 2017-02-18 ENCOUNTER — Other Ambulatory Visit: Payer: Self-pay | Admitting: Neurology

## 2017-11-04 DIAGNOSIS — R569 Unspecified convulsions: Secondary | ICD-10-CM | POA: Insufficient documentation

## 2017-11-04 DIAGNOSIS — I2510 Atherosclerotic heart disease of native coronary artery without angina pectoris: Secondary | ICD-10-CM | POA: Insufficient documentation

## 2017-11-04 NOTE — Progress Notes (Signed)
[facility]  PATIENT: Mark Salas DOB: 01/06/1951   REASON FOR VISIT: Follow-up for seizure disorder HISTORY FROM:    HISTORY OF PRESENT ILLNESS:UPDATE 11/9/2018CM [name], 66 year old male returns for follow-up with history of seizure disorder.  Last seizure was approximately 2 years ago.  He is currently on Keppra 500 mg twice daily without side effects.  He specifically denies any irritability or drowsiness on Keppra.  He continues to work full-time, drives a vehicle without difficulty.  No new medical issues since last seen.  He returns for reevaluation  10/30/16 KWMr. Mark Salas is a [age] old right-handed white male with a history of seizures. The patient had not tolerated the Dilantin well, he was switched to Keppra, he has done well with this medication. He has gone 6 months without any seizures. He denies any drowsiness or irritability on the Keppra. He denies any other new medical issues that have come up since last seen. He returns for an evaluation  REVIEW OF SYSTEMS: Full 14 system review of systems performed and notable only for those listed, all others are neg:  Constitutional: neg  Cardiovascular: neg Ear/Nose/Throat: neg  Skin: neg Eyes: neg Respiratory: neg Gastroitestinal: neg  Hematology/Lymphatic: neg  Endocrine: neg Musculoskeletal:neg Allergy/Immunology: neg Neurological: neg Psychiatric: neg Sleep : neg   ALLERGIES: No Known Allergies  HOME MEDICATIONS: Outpatient Medications Prior to Visit  Medication Sig Dispense Refill  . Calcium Citrate-Vitamin D (CALCIUM + D PO) Take by mouth.    . levETIRAcetam (KEPPRA) 500 MG tablet TAKE ONE TABLET BY MOUTH TWICE DAILY 180 tablet 2  . Multiple Vitamin (MULTIVITAMIN) tablet Take 1 tablet by mouth daily.      Marland Kitchen. glucosamine-chondroitin (GLUCOSAMINE-CHONDROITIN DS) 500-400 MG tablet Take by mouth.     No facility-administered medications prior to visit.     PAST MEDICAL HISTORY: Past Medical  History:  Diagnosis Date  . Drowsiness    Excessive daytime drowsiness  . Hypotension   . Obesity   . Other abnormal glucose   . Seizures (HCC)   . Syncope and collapse     PAST SURGICAL HISTORY: Past Surgical History:  Procedure Laterality Date  . CARDIAC DEFIBRILLATOR PLACEMENT    . KNEE SURGERY     right  . loop recorder     Medtronic Reveal  . TONSILLECTOMY      FAMILY HISTORY: Family History  Problem Relation Age of Onset  . Stroke Mother   . Heart attack Father   . Stroke Father     SOCIAL HISTORY: Social History   Socioeconomic History  . Marital status: Married    Spouse name: Mark Salas  . Number of children: 2  . Years of education: Masters  . Highest education level: Not on file  Social Needs  . Financial resource strain: Not on file  . Food insecurity - worry: Not on file  . Food insecurity - inability: Not on file  . Transportation needs - medical: Not on file  . Transportation needs - non-medical: Not on file  Occupational History  . Occupation: Scientist, forensicurse Anesthetist    Employer: [employer]    Employer: [employer]  Tobacco Use  . Smoking status: Never Smoker  . Smokeless tobacco: Never Used  Substance and Sexual Activity  . Alcohol use: Yes    Alcohol/week: 0.5 - 1.0 oz    Types: 1 - 2 Standard drinks or equivalent per week    Comment: Consumes 2-3 drinks per day  . Drug  use: No  . Sexual activity: Not on file  Other Topics Concern  . Not on file  Social History Narrative   Patient lives at home with his wife Mark Cloud(Mark Salas).   Patient works full time at [employer].   [employer].   Right handed.   Caffeine: 1-2 soda daily.     PHYSICAL EXAM  Vitals:   11/05/17 0804  BP: (!) 145/84  Pulse: 71  Weight: 260 lb 12.8 oz (118.3 kg)   Body mass index is 36.37 kg/m.  Generalized: Well developed, in no acute distress  Head: normocephalic and atraumatic,. Oropharynx benign  Neck: Supple,  Musculoskeletal: No  deformity   Neurological examination   Mentation: Alert oriented to time, place, history taking. Attention span and concentration appropriate. Recent and remote memory intact.  Follows all commands speech and language fluent.   Cranial nerve II-XII: Pupils were equal round reactive to light extraocular movements were full, visual field were full on confrontational test. Facial sensation and strength were normal. hearing was intact to finger rubbing bilaterally. Uvula tongue midline. head turning and shoulder shrug were normal and symmetric.Tongue protrusion into cheek strength was normal. Motor: normal bulk and tone, full strength in the BUE, BLE, fine finger movements normal, no pronator drift. No focal weakness Sensory: normal and symmetric to light touch,  Coordination: finger-nose-finger, heel-to-shin bilaterally, no dysmetria Reflexes: Symmetric upper and lower, plantar responses were flexor bilaterally. Gait and Station: Rising up from seated position without assistance, normal stance,  moderate stride, good arm swing, smooth turning. Tandem gait is steady  DIAGNOSTIC DATA (LABS, IMAGING, TESTING) -Reviewed labs 05/07/2017 on care everywhere from primary care provider   ASSESSMENT AND PLAN  66 y.o. year old male  has a past medical history of seizure disorder.  Last seizure 2 years ago   Continue Keppra 500 mg twice daily Call for any seizure activity Follow-up yearly and as needed Mark RiggsNancy Carolyn Lennon Salas, [facility] ServicesGNP, [facility], APRN  [facility] [address], [address] [place], [place] [address] [phone]

## 2017-11-05 ENCOUNTER — Ambulatory Visit (INDEPENDENT_AMBULATORY_CARE_PROVIDER_SITE_OTHER): Payer: PRIVATE HEALTH INSURANCE | Admitting: Podiatry

## 2017-11-05 ENCOUNTER — Encounter: Payer: Self-pay | Admitting: Nurse Practitioner

## 2017-11-05 ENCOUNTER — Ambulatory Visit (INDEPENDENT_AMBULATORY_CARE_PROVIDER_SITE_OTHER): Payer: PRIVATE HEALTH INSURANCE | Admitting: Nurse Practitioner

## 2017-11-05 VITALS — BP 145/84 | HR 71 | Wt 260.8 lb

## 2017-11-05 DIAGNOSIS — M2142 Flat foot [pes planus] (acquired), left foot: Secondary | ICD-10-CM | POA: Diagnosis not present

## 2017-11-05 DIAGNOSIS — M2141 Flat foot [pes planus] (acquired), right foot: Secondary | ICD-10-CM

## 2017-11-05 DIAGNOSIS — G40309 Generalized idiopathic epilepsy and epileptic syndromes, not intractable, without status epilepticus: Secondary | ICD-10-CM

## 2017-11-05 DIAGNOSIS — M76822 Posterior tibial tendinitis, left leg: Secondary | ICD-10-CM

## 2017-11-05 MED ORDER — LEVETIRACETAM 500 MG PO TABS: 500.0000 mg | ORAL_TABLET | Freq: Two times a day (BID) | ORAL | 11 refills | Status: DC

## 2017-11-05 NOTE — Progress Notes (Signed)
I have read the note, and I agree with the clinical assessment and plan.  Dashiell Franchino KEITH   

## 2017-11-05 NOTE — Progress Notes (Signed)
   Subjective:    Patient ID: Mark Salas, male    DOB: 06/05/1951, 66 y.o.   MRN: [id number]  HPI    Review of Systems  All other systems reviewed and are negative.      Objective:   Physical Exam        Assessment & Plan:

## 2017-11-05 NOTE — Patient Instructions (Signed)
Continue Keppra 500 mg twice daily Call for any seizure activity Follow-up yearly and as needed 

## 2017-11-08 NOTE — Progress Notes (Signed)
   Subjective:  Patient presents for evaluation of bilateral lower extremity pain. Patient experiences moderate pain upon activity and long periods of walking. He reports having orthotics made several years ago for pes planus but states they are now old and he is requesting a new pair. Patient presents today for further treatment and evaluation.   Past Medical History:  Diagnosis Date  . Drowsiness    Excessive daytime drowsiness  . Hypotension   . Obesity   . Other abnormal glucose   . Seizures (HCC)   . Syncope and collapse       Objective/Physical Exam General: The patient is alert and oriented x3 in no acute distress.  Dermatology: Skin is warm, dry and supple bilateral lower extremities. Negative for open lesions or macerations.  Vascular: Palpable pedal pulses bilaterally. No edema or erythema noted. Capillary refill within normal limits.  Neurological: Epicritic and protective threshold grossly intact bilaterally.   Musculoskeletal Exam: Range of motion within normal limits to all pedal and ankle joints bilateral. Muscle strength 5/5 in all groups bilateral.  Upon weightbearing there is a medial longitudinal arch collapse bilaterally. Remove foot valgus noted to the bilateral lower extremities with excessive pronation upon mid stance.   Assessment: #1 pes planus bilateral #2 pain in bilateral feet   Plan of Care:  #1 Patient was evaluated. #2 Patient molded for custom orthotics today. #3 Return to clinic in 4 weeks to pick up orthotics.    Felecia ShellingBrent M. Philicia Heyne, DPM [facility]  Dr. Felecia ShellingBrent M. Isma Tietje, DPM    [address]                                        [place], [place] [address]                Office [phone]  Fax [phone]

## 2017-12-03 ENCOUNTER — Ambulatory Visit (INDEPENDENT_AMBULATORY_CARE_PROVIDER_SITE_OTHER): Payer: PRIVATE HEALTH INSURANCE | Admitting: Podiatry

## 2017-12-03 ENCOUNTER — Ambulatory Visit: Payer: PRIVATE HEALTH INSURANCE | Admitting: Podiatry

## 2017-12-03 ENCOUNTER — Encounter: Payer: Self-pay | Admitting: Podiatry

## 2017-12-03 DIAGNOSIS — M2141 Flat foot [pes planus] (acquired), right foot: Secondary | ICD-10-CM

## 2017-12-03 DIAGNOSIS — M2142 Flat foot [pes planus] (acquired), left foot: Secondary | ICD-10-CM

## 2017-12-03 NOTE — Progress Notes (Signed)
Patient presents for orthotic pick up.  Verbal and written break in and wear instructions given.  Patient will follow up in 4 weeks if symptoms worsen or fail to improve. 

## 2017-12-03 NOTE — Patient Instructions (Signed)

## 2017-12-31 ENCOUNTER — Ambulatory Visit: Payer: PRIVATE HEALTH INSURANCE | Admitting: Podiatry

## 2018-11-04 ENCOUNTER — Ambulatory Visit: Payer: PRIVATE HEALTH INSURANCE | Admitting: Nurse Practitioner

## 2018-11-16 ENCOUNTER — Other Ambulatory Visit: Payer: Self-pay | Admitting: Nurse Practitioner

## 2018-12-29 NOTE — Progress Notes (Signed)
[facility]  PATIENT: Mark Salas DOB: [birth year]   REASON FOR VISIT: Follow-up for seizure disorder HISTORY FROM:patient   HISTORY OF PRESENT ILLNESS:UPDATE 1/3/2020CM Mark Salas 68 year old male returns for follow-up with a history of seizure disorder.  His last seizure occurred 3 years ago.  He is on Keppra 500 mg twice daily without side effects.  He continues to work full-time.  He continues to drive a vehicle without difficulty.  He has had no interval medical issues.  He returns for reevaluation and refills.   UPDATE 11/9/2018CM Mark Salas, 68 year old male returns for follow-up with history of seizure disorder.  Last seizure was approximately 2 years ago.  He is currently on Keppra 500 mg twice daily without side effects.  He specifically denies any irritability or drowsiness on Keppra.  He continues to work full-time, drives a vehicle without difficulty.  No new medical issues since last seen.  He returns for reevaluation  10/30/16 KWMr. Salas is 68 year old right-handed white male with a history of seizures. The patient had not tolerated the Dilantin well, he was switched to Keppra, he has done well with this medication. He has gone 6 months without any seizures. He denies any drowsiness or irritability on the Keppra. He denies any other new medical issues that have come up since last seen. He returns for an evaluation  REVIEW OF SYSTEMS: Full 14 system review of systems performed and notable only for those listed, all others are neg:  Constitutional: neg  Cardiovascular: neg Ear/Nose/Throat: neg  Skin: neg Eyes: neg Respiratory: neg Gastroitestinal: neg  Hematology/Lymphatic: neg  Endocrine: neg Musculoskeletal:neg Allergy/Immunology: neg Neurological: History of seizure disorder Psychiatric: neg Sleep : neg   ALLERGIES: No Known Allergies  HOME MEDICATIONS: Outpatient Medications Prior to Visit  Medication Sig Dispense Refill  . Calcium  Citrate-Vitamin D (CALCIUM + D PO) Take by mouth.    . levETIRAcetam (KEPPRA) 500 MG tablet TAKE 1 TABLET BY MOUTH TWICE DAILY 60 tablet 11  . Multiple Vitamin (MULTIVITAMIN) tablet Take 1 tablet by mouth daily.       No facility-administered medications prior to visit.     PAST MEDICAL HISTORY: Past Medical History:  Diagnosis Date  . Drowsiness    Excessive daytime drowsiness  . Hypotension   . Obesity   . Other abnormal glucose   . Seizures (HCC)   . Syncope and collapse     PAST SURGICAL HISTORY: Past Surgical History:  Procedure Laterality Date  . CARDIAC DEFIBRILLATOR PLACEMENT    . KNEE SURGERY     right  . loop recorder     Medtronic Reveal  . TONSILLECTOMY      FAMILY HISTORY: Family History  Problem Relation Age of Onset  . Stroke Mother   . Heart attack Father   . Stroke Father     SOCIAL HISTORY: Social History   Socioeconomic History  . Marital status: Married    Spouse name: Rinaldo Cloud  . Number of children: 2  . Years of education: Masters  . Highest education level: Not on file  Occupational History  . Occupation: Scientist, forensic: Government social research officer: [employer]  Social Needs  . Financial resource strain: Not on file  . Food insecurity:    Worry: Not on file    Inability: Not on file  . Transportation needs:    Medical: Not on file    Non-medical: Not on file  Tobacco  Use  . Smoking status: Never Smoker  . Smokeless tobacco: Never Used  Substance and Sexual Activity  . Alcohol use: Yes    Comment: about 4 oz of alcohol per week  . Drug use: No  . Sexual activity: Not on file  Lifestyle  . Physical activity:    Days per week: Not on file    Minutes per session: Not on file  . Stress: Not on file  Relationships  . Social connections:    Talks on phone: Not on file    Gets together: Not on file    Attends religious service: Not on file    Active member of club or organization: Not on file     Attends meetings of clubs or organizations: Not on file    Relationship status: Not on file  . Intimate partner violence:    Fear of current or ex partner: Not on file    Emotionally abused: Not on file    Physically abused: Not on file    Forced sexual activity: Not on file  Other Topics Concern  . Not on file  Social History Narrative   Patient lives at home with his wife Rinaldo Cloud).   Patient works full time at [employer].   [employer].   Right handed.   Caffeine: 4-5 sodas a week     PHYSICAL EXAM  Vitals:   12/30/18 0832  BP: (!) 142/81  Pulse: 69  Weight: 258 lb (117 kg)  Height: 5\' 11"  (1.803 m)   Body mass index is 35.98 kg/m.  Generalized: Well developed, in no acute distress  Head: normocephalic and atraumatic,. Oropharynx benign  Neck: Supple,  Musculoskeletal: No deformity   Neurological examination   Mentation: Alert oriented to time, place, history taking. Attention span and concentration appropriate. Recent and remote memory intact.  Follows all commands speech and language fluent.   Cranial nerve II-XII: Pupils were equal round reactive to light extraocular movements were full, visual field were full on confrontational test. Facial sensation and strength were normal. hearing was intact to finger rubbing bilaterally. Uvula tongue midline. head turning and shoulder shrug were normal and symmetric.Tongue protrusion into cheek strength was normal. Motor: normal bulk and tone, full strength in the BUE, BLE, fine finger movements normal, no pronator drift. No focal weakness Sensory: normal and symmetric to light touch, on the face arms and legs Coordination: finger-nose-finger, heel-to-shin bilaterally, no dysmetria Reflexes: Symmetric upper and lower, plantar responses were flexor bilaterally. Gait and Station: Rising up from seated position without assistance, normal stance,  moderate stride, good arm swing, smooth turning. Tandem gait is steady.   No assistive device  DIAGNOSTIC DATA (LABS, IMAGING, TESTING) -Reviewed labs done 12/02/2018 on care everywhere from primary care provider   ASSESSMENT AND PLAN  68 y.o. year old male  has a past medical history of seizure disorder.  Last seizure 3 years ago   Continue Keppra 500 mg twice daily Call for any seizure activity Follow-up yearly and as needed Nilda Riggs, [facility], [facility], APRN  [facility] [address] [place], [place] [address] [phone]

## 2018-12-30 ENCOUNTER — Ambulatory Visit: Payer: PRIVATE HEALTH INSURANCE | Admitting: Nurse Practitioner

## 2018-12-30 ENCOUNTER — Encounter: Payer: Self-pay | Admitting: Nurse Practitioner

## 2018-12-30 VITALS — BP 142/81 | HR 69 | Ht 71.0 in | Wt 258.0 lb

## 2018-12-30 DIAGNOSIS — G40309 Generalized idiopathic epilepsy and epileptic syndromes, not intractable, without status epilepticus: Secondary | ICD-10-CM

## 2018-12-30 MED ORDER — LEVETIRACETAM 500 MG PO TABS: 500.0000 mg | ORAL_TABLET | Freq: Two times a day (BID) | ORAL | 11 refills | Status: AC

## 2018-12-30 NOTE — Patient Instructions (Signed)
Continue Keppra 500 mg twice daily Call for any seizure activity Follow-up yearly and as needed

## 2018-12-30 NOTE — Progress Notes (Signed)
I have read the note, and I agree with the clinical assessment and plan.  Edris Schneck K Joella Saefong   

## 2019-10-16 ENCOUNTER — Telehealth: Payer: Self-pay | Admitting: Neurology

## 2019-10-16 NOTE — Telephone Encounter (Signed)
lvm for pt to call back and r/s 01/05/20 appt due to office being closed

## 2019-10-30 ENCOUNTER — Encounter: Payer: Self-pay | Admitting: Neurology

## 2019-10-30 NOTE — Telephone Encounter (Signed)
lvm x 2 c/a letter sent  

## 2020-01-05 ENCOUNTER — Ambulatory Visit: Payer: PRIVATE HEALTH INSURANCE | Admitting: Neurology
